# Patient Record
Sex: Male | Born: 1995 | Race: Black or African American | Hispanic: No | Marital: Single | Smoking: Never smoker
Health system: Southern US, Community
[De-identification: ages and names within clinical notes are randomized; demographics above are authoritative.]

## PROBLEM LIST (undated history)

## (undated) DIAGNOSIS — J45909 Unspecified asthma, uncomplicated: Secondary | ICD-10-CM

## (undated) HISTORY — PX: NO PAST SURGERIES: SHX2092

---
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
## 2015-12-05 IMAGING — CR DG CHEST 2V
2 series · 2 of 2 positions shown · non-contrast
Comparison: None.

CLINICAL DATA: Shortness of breath and wheezing. Bilateral anterior
rib pain for 2 days, progressive. Asthma.

EXAM:
CHEST  2 VIEW

[w chest pa]
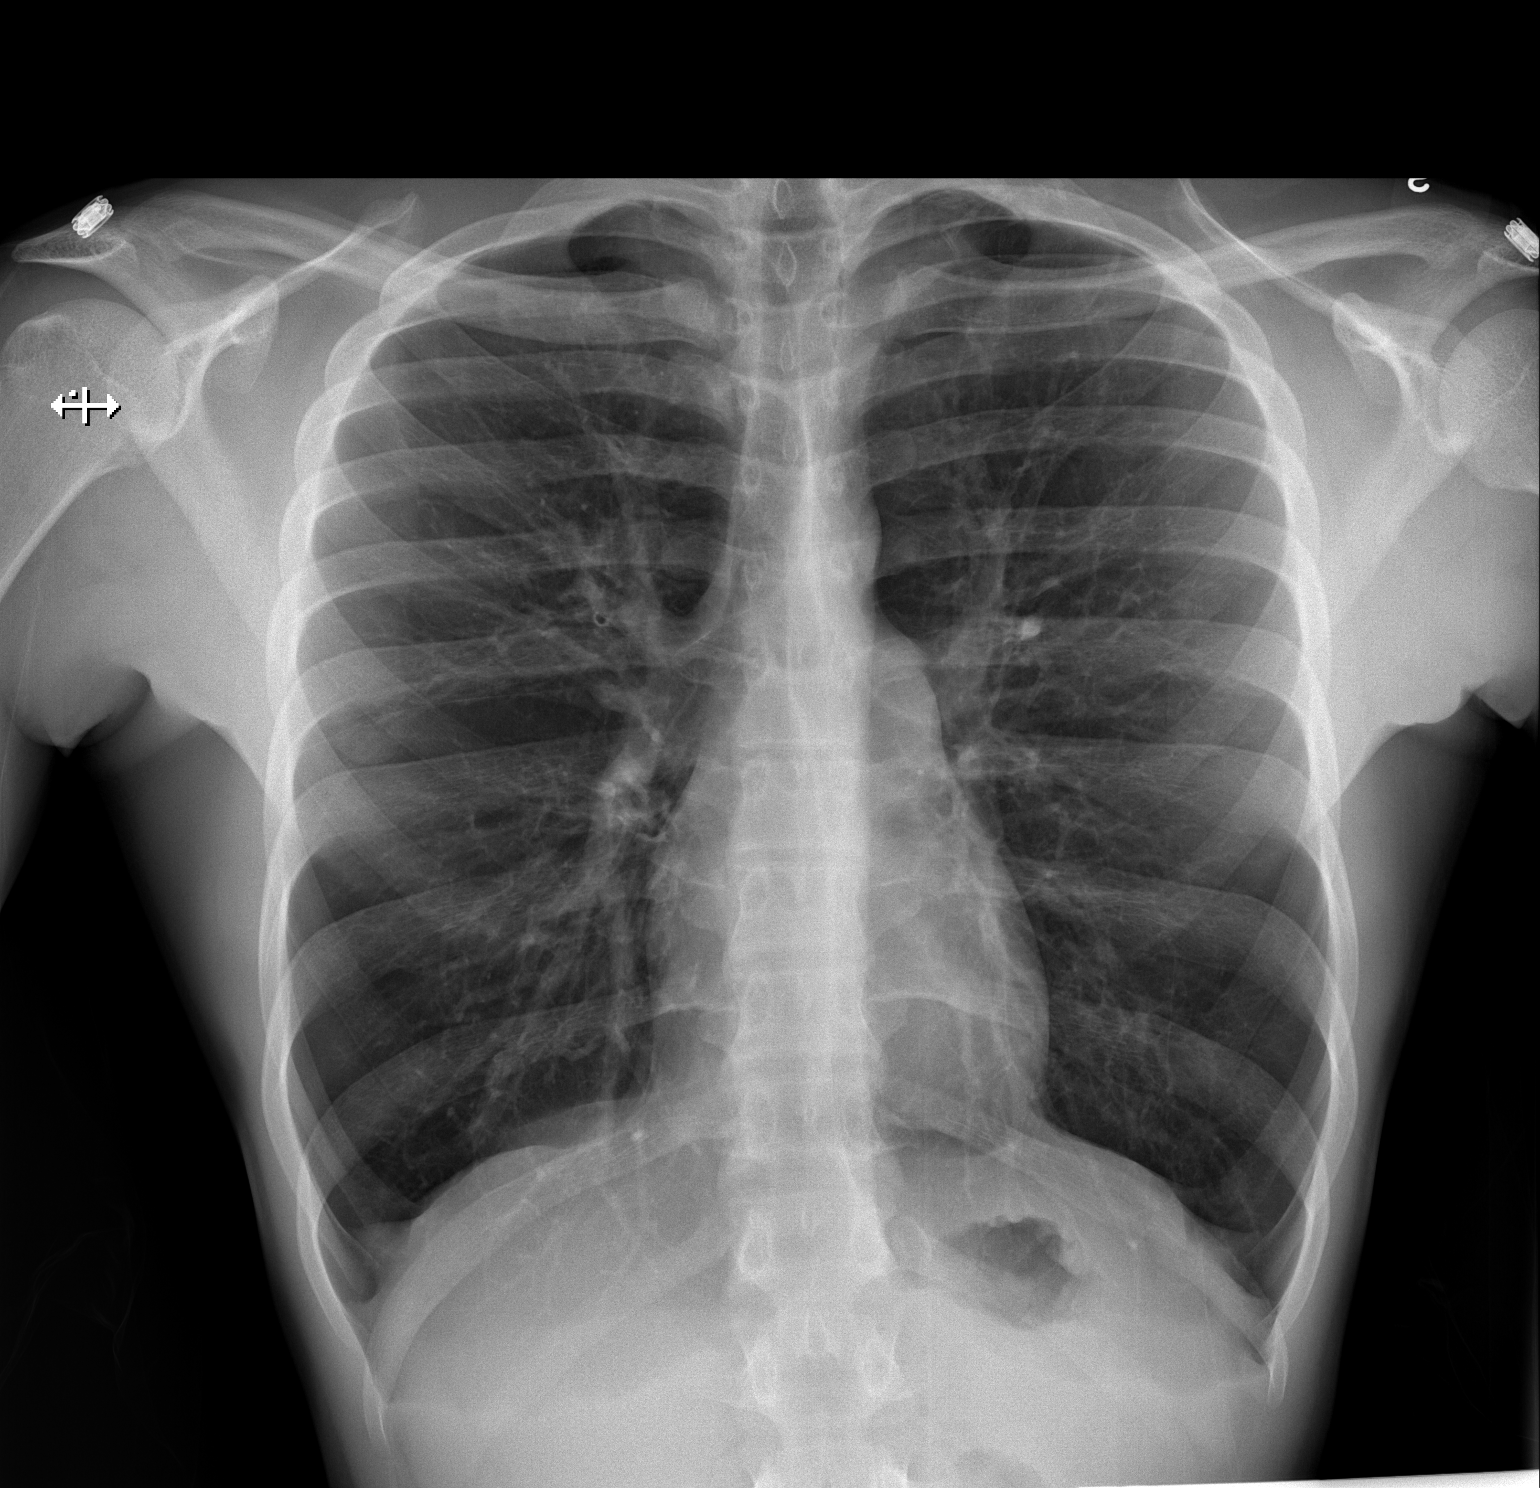

[w chest lat]
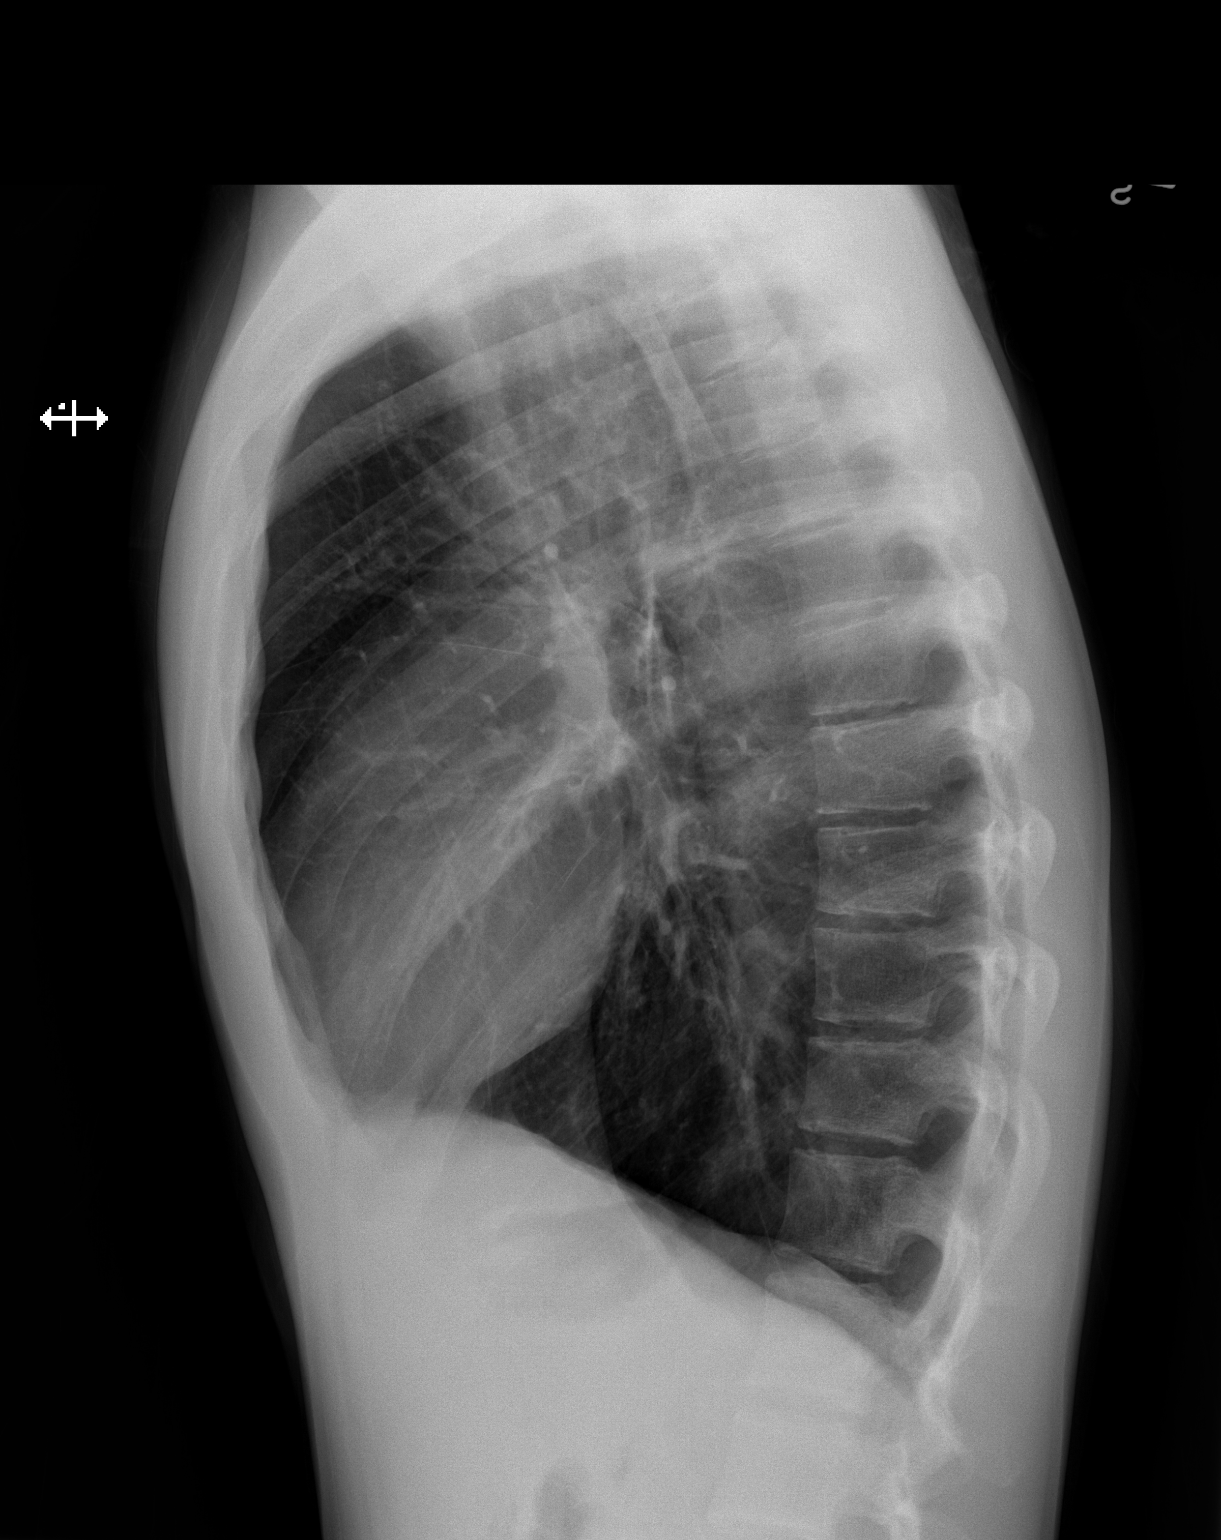

[2 of 2 positions shown; findings below may reference images not displayed]

FINDINGS: The lungs are markedly hyperinflated with peribronchial thickening.
No infiltrates or effusions. No pneumothorax. No osseous
abnormality.
IMPRESSION: Hyperinflated lungs with bronchitic changes. Findings are consistent
with asthma.

## 2016-11-08 ENCOUNTER — Emergency Department (HOSPITAL_COMMUNITY)
Admission: EM | Admit: 2016-11-08 | Discharge: 2016-11-08 | Disposition: A | Discharge status: Home / Self Care | Payer: Self-pay | Attending: Emergency Medicine | Admitting: Emergency Medicine

## 2016-11-08 ENCOUNTER — Encounter (HOSPITAL_COMMUNITY): Payer: Self-pay | Admitting: Emergency Medicine

## 2016-11-08 ENCOUNTER — Emergency Department (HOSPITAL_COMMUNITY): Payer: Self-pay

## 2016-11-08 DIAGNOSIS — J45901 Unspecified asthma with (acute) exacerbation: Secondary | ICD-10-CM | POA: Insufficient documentation

## 2016-11-08 DIAGNOSIS — Z91010 Allergy to peanuts: Secondary | ICD-10-CM | POA: Insufficient documentation

## 2016-11-08 MED ORDER — ALBUTEROL SULFATE (2.5 MG/3ML) 0.083% IN NEBU
5.0000 mg | INHALATION_SOLUTION | Freq: Once | RESPIRATORY_TRACT | Status: AC
  Administered 2016-11-08: 5 mg via RESPIRATORY_TRACT
  Filled 2016-11-08: qty 6

## 2016-11-08 MED ORDER — ALBUTEROL SULFATE (2.5 MG/3ML) 0.083% IN NEBU
5.0000 mg | INHALATION_SOLUTION | Freq: Once | RESPIRATORY_TRACT | Status: AC
  Administered 2016-11-08: 5 mg via RESPIRATORY_TRACT
  Filled 2016-11-08 (×2): qty 6

## 2016-11-08 MED ORDER — FLUTICASONE PROPIONATE 50 MCG/ACT NA SUSP: 2.0000 | Freq: Every day | NASAL | 2 refills | Status: AC

## 2016-11-08 MED ORDER — CETIRIZINE HCL 10 MG PO TABS: 10.0000 mg | ORAL_TABLET | Freq: Every day | ORAL | 1 refills | Status: AC

## 2016-11-08 MED ORDER — PREDNISONE 20 MG PO TABS
60.0000 mg | ORAL_TABLET | Freq: Once | ORAL | Status: AC
  Administered 2016-11-08: 60 mg via ORAL
  Filled 2016-11-08: qty 3

## 2016-11-08 MED ORDER — IPRATROPIUM BROMIDE 0.02 % IN SOLN
0.5000 mg | Freq: Once | RESPIRATORY_TRACT | Status: AC
  Administered 2016-11-08: 0.5 mg via RESPIRATORY_TRACT
  Filled 2016-11-08 (×2): qty 2.5

## 2016-11-08 MED ORDER — IPRATROPIUM BROMIDE 0.02 % IN SOLN
0.5000 mg | Freq: Once | RESPIRATORY_TRACT | Status: AC
  Administered 2016-11-08: 0.5 mg via RESPIRATORY_TRACT
  Filled 2016-11-08: qty 2.5

## 2016-11-08 MED ORDER — PREDNISONE 20 MG PO TABS: 40.0000 mg | ORAL_TABLET | Freq: Every day | ORAL | 0 refills | Status: AC

## 2016-11-08 NOTE — ED Notes (Signed)
Patient transported to X-ray 

## 2016-11-08 NOTE — ED Notes (Signed)
Pt sats 94 to 100% while walking on room air

## 2016-11-08 NOTE — Discharge Instructions (Signed)
1. Medications: albuterol, prednisone, flonase, usual home medications °2. Treatment: rest, drink plenty of fluids, begin OTC antihistamine (Zyrtec or Claritin) °3. Follow Up: Please followup with your primary doctor in 2-3 days for discussion of your diagnoses and further evaluation after today's visit; if you do not have a primary care doctor use the resource guide provided to find one; Please return to the ER for difficulty breathing, high fevers or worsening symptoms. ° °

## 2016-11-08 NOTE — ED Provider Notes (Signed)
[facility]-EMERGENCY DEPT Provider Note   CSN: [id number] Arrival date & time: 11/08/16  0421     History   Chief Complaint Chief Complaint  Patient presents with  . Asthma    HPI Clifford Little is a 20 y.o. male with a hx of Asthma presents to the Emergency Department complaining of gradual, persistent, progressively worsening shortness of breath and wheezing onset 3 days ago. Patient reports that his albuterol inhaler was helping until last night around 9 PM when he ran out. He reports taking albuterol rescue inhaler and Singulair. He reports compliance until the prescription ran out earlier tonight. He states he's having trouble sleeping because of his shortness of breath. He is not taking any antihistamine.  Nothing makes symptoms worse. No fevers or chills, nausea or vomiting. No sick contacts. Patient reports a history of hospitalization but has never been intubated for his asthma. Last dose of steroids was greater than 3 months ago.  The history is provided by the patient and medical records. No language interpreter was used.    Past Medical History:  Diagnosis Date  . Asthma     Patient Active Problem List   Diagnosis Date Noted  . Asthma exacerbation 11/14/2014    Past Surgical History:  Procedure Laterality Date  . NO PAST SURGERIES      OB History    No data available       Home Medications    Prior to Admission medications   Medication Sig Start Date End Date Taking? Authorizing Provider  albuterol (PROVENTIL) (2.5 MG/3ML) 0.083% nebulizer solution Inhale 5 mg into the lungs every 4 (four) hours as needed for wheezing or shortness of breath.  08/09/14  Yes Historical Provider, MD  montelukast (SINGULAIR) 10 MG tablet Take 10 mg by mouth at bedtime as needed (allergies).  08/09/14  Yes Historical Provider, MD  VENTOLIN HFA 108 (90 BASE) MCG/ACT inhaler Inhale 2 puffs into the lungs every 4 (four) hours as needed. For wheezing and shortness of breath. 08/09/14   Yes Historical Provider, MD  cetirizine (ZYRTEC ALLERGY) 10 MG tablet Take 1 tablet (10 mg total) by mouth daily. 11/08/16   Georgia Baria, PA-C  fluticasone (FLONASE) 50 MCG/ACT nasal spray Place 2 sprays into both nostrils daily. 11/08/16   Juanangel Soderholm, PA-C  predniSONE (DELTASONE) 20 MG tablet Take 2 tablets (40 mg total) by mouth daily. 11/08/16   Dahlia ClientHannah Jehiel Koepp, PA-C    Family History Family History  Problem Relation Age of Onset  . Asthma Father     Social History Social History  Substance Use Topics  . Smoking status: Never Smoker  . Smokeless tobacco: Never Used  . Alcohol use No     Allergies   Peanuts [peanut oil]   Review of Systems Review of Systems  Respiratory: Positive for cough, shortness of breath and wheezing.   All other systems reviewed and are negative.    Physical Exam Updated Vital Signs BP 114/70   Pulse (!) 59   Temp 98.3 F (36.8 C) (Oral)   Resp 20   Ht 5\' 8"  (1.727 m)   Wt 56.7 kg   SpO2 100%   BMI 19.01 kg/m   Physical Exam  Constitutional: He appears well-developed and well-nourished. No distress.  Awake, alert, nontoxic appearance  HENT:  Head: Normocephalic and atraumatic.  Mouth/Throat: Oropharynx is clear and moist. No oropharyngeal exudate.  Eyes: Conjunctivae are normal. No scleral icterus.  Neck: Normal range of motion. Neck supple.  Cardiovascular: Normal rate,  regular rhythm and intact distal pulses.   Pulmonary/Chest: Effort normal. Tachypnea noted. No respiratory distress. He has decreased breath sounds. He has wheezes.  Equal chest expansion Inspiratory and Expiratory wheezes throughout  Abdominal: Soft. Bowel sounds are normal. He exhibits no mass. There is no tenderness. There is no rebound and no guarding.  Musculoskeletal: Normal range of motion. He exhibits no edema.  Neurological: He is alert.  Speech is clear and goal oriented Moves extremities without ataxia  Skin: Skin is warm and dry.  He is not diaphoretic.  Psychiatric: He has a normal mood and affect.  Nursing note and vitals reviewed.    ED Treatments / Results   Radiology Dg Chest 2 View  Result Date: 11/08/2016 CLINICAL DATA:  20 y/o M; shortness of breath with history of asthma. EXAM: CHEST  2 VIEW COMPARISON:  11/14/2014 chest radiograph FINDINGS: The heart size and mediastinal contours are within normal limits and stable. Both lungs are clear. The visualized skeletal structures are unremarkable. IMPRESSION: No active cardiopulmonary disease. Electronically Signed   By: Mitzi HansenLance  Furusawa-Stratton M.D.   On: 11/08/2016 05:11    Procedures Procedures (including critical care time)  Medications Ordered in ED Medications  albuterol (PROVENTIL) (2.5 MG/3ML) 0.083% nebulizer solution 5 mg (5 mg Nebulization Given 11/08/16 0435)  albuterol (PROVENTIL) (2.5 MG/3ML) 0.083% nebulizer solution 5 mg (5 mg Nebulization Given 11/08/16 0532)  ipratropium (ATROVENT) nebulizer solution 0.5 mg (0.5 mg Nebulization Given 11/08/16 0533)  predniSONE (DELTASONE) tablet 60 mg (60 mg Oral Given 11/08/16 0533)  albuterol (PROVENTIL) (2.5 MG/3ML) 0.083% nebulizer solution 5 mg (5 mg Nebulization Given 11/08/16 0648)  ipratropium (ATROVENT) nebulizer solution 0.5 mg (0.5 mg Nebulization Given 11/08/16 0648)     Initial Impression / Assessment and Plan / ED Course  I have reviewed the triage vital signs and the nursing notes.  Pertinent labs & imaging results that were available during my care of the patient were reviewed by me and considered in my medical decision making (see chart for details).  Clinical Course     He continues to wheeze but is no distress and is without hypoxia at rest.  He wishes for discharge home. I have discussed risk and benefit of admission versus discharge home. I have offered him admission due to persistent wheezing however he reports he believes he is breathing very close to his baseline.    Patient is  completing his third albuterol treatment. He will ambulate in the hall with oxygen saturation measurement.  At shift change, care transferred to Dr. Nicanor AlconPalumbo who will reassess and determine disposition.    Final Clinical Impressions(s) / ED Diagnoses   Final diagnoses:  Exacerbation of asthma, unspecified asthma severity, unspecified whether persistent    New Prescriptions New Prescriptions   CETIRIZINE (ZYRTEC ALLERGY) 10 MG TABLET    Take 1 tablet (10 mg total) by mouth daily.   FLUTICASONE (FLONASE) 50 MCG/ACT NASAL SPRAY    Place 2 sprays into both nostrils daily.   PREDNISONE (DELTASONE) 20 MG TABLET    Take 2 tablets (40 mg total) by mouth daily.     Dierdre ForthHannah Ladislaus Repsher, PA-C 11/08/16 [phone]    April Palumbo, MD 11/08/16 [phone]

## 2016-11-08 NOTE — ED Notes (Signed)
Pt denies SOB, states medications helped

## 2016-11-08 NOTE — ED Triage Notes (Signed)
Pt has h/s of asthma and ran out of medications. Feels SOB that started around 2100 last night. Did not take any medications

## 2017-11-29 IMAGING — CR DG CHEST 2V
2 series · 2 of 2 positions shown · non-contrast
Comparison: 11/14/2014 chest radiograph

CLINICAL DATA: 20 y/o M; shortness of breath with history of
asthma.

EXAM:
CHEST  2 VIEW

[chest pa]
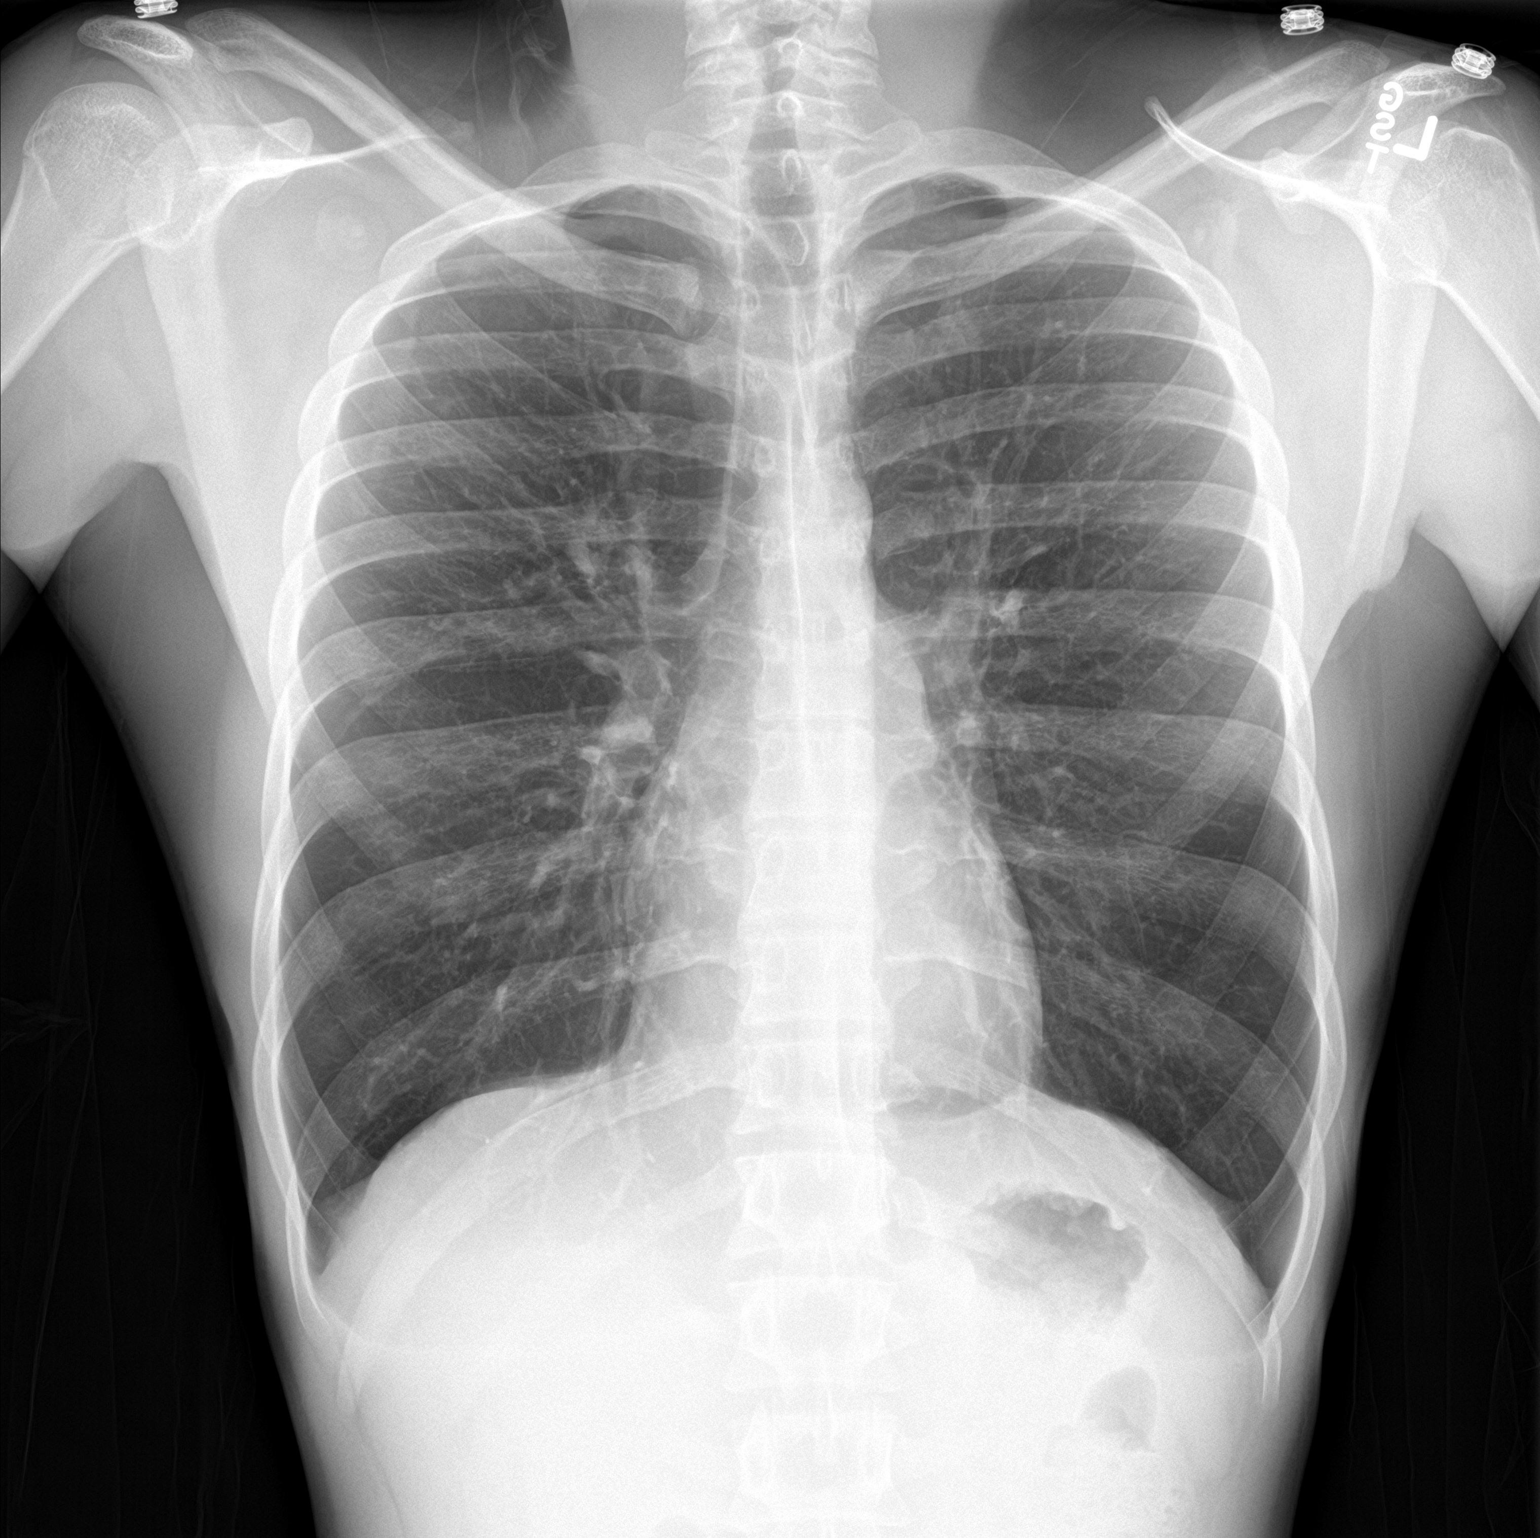

[chest lat]
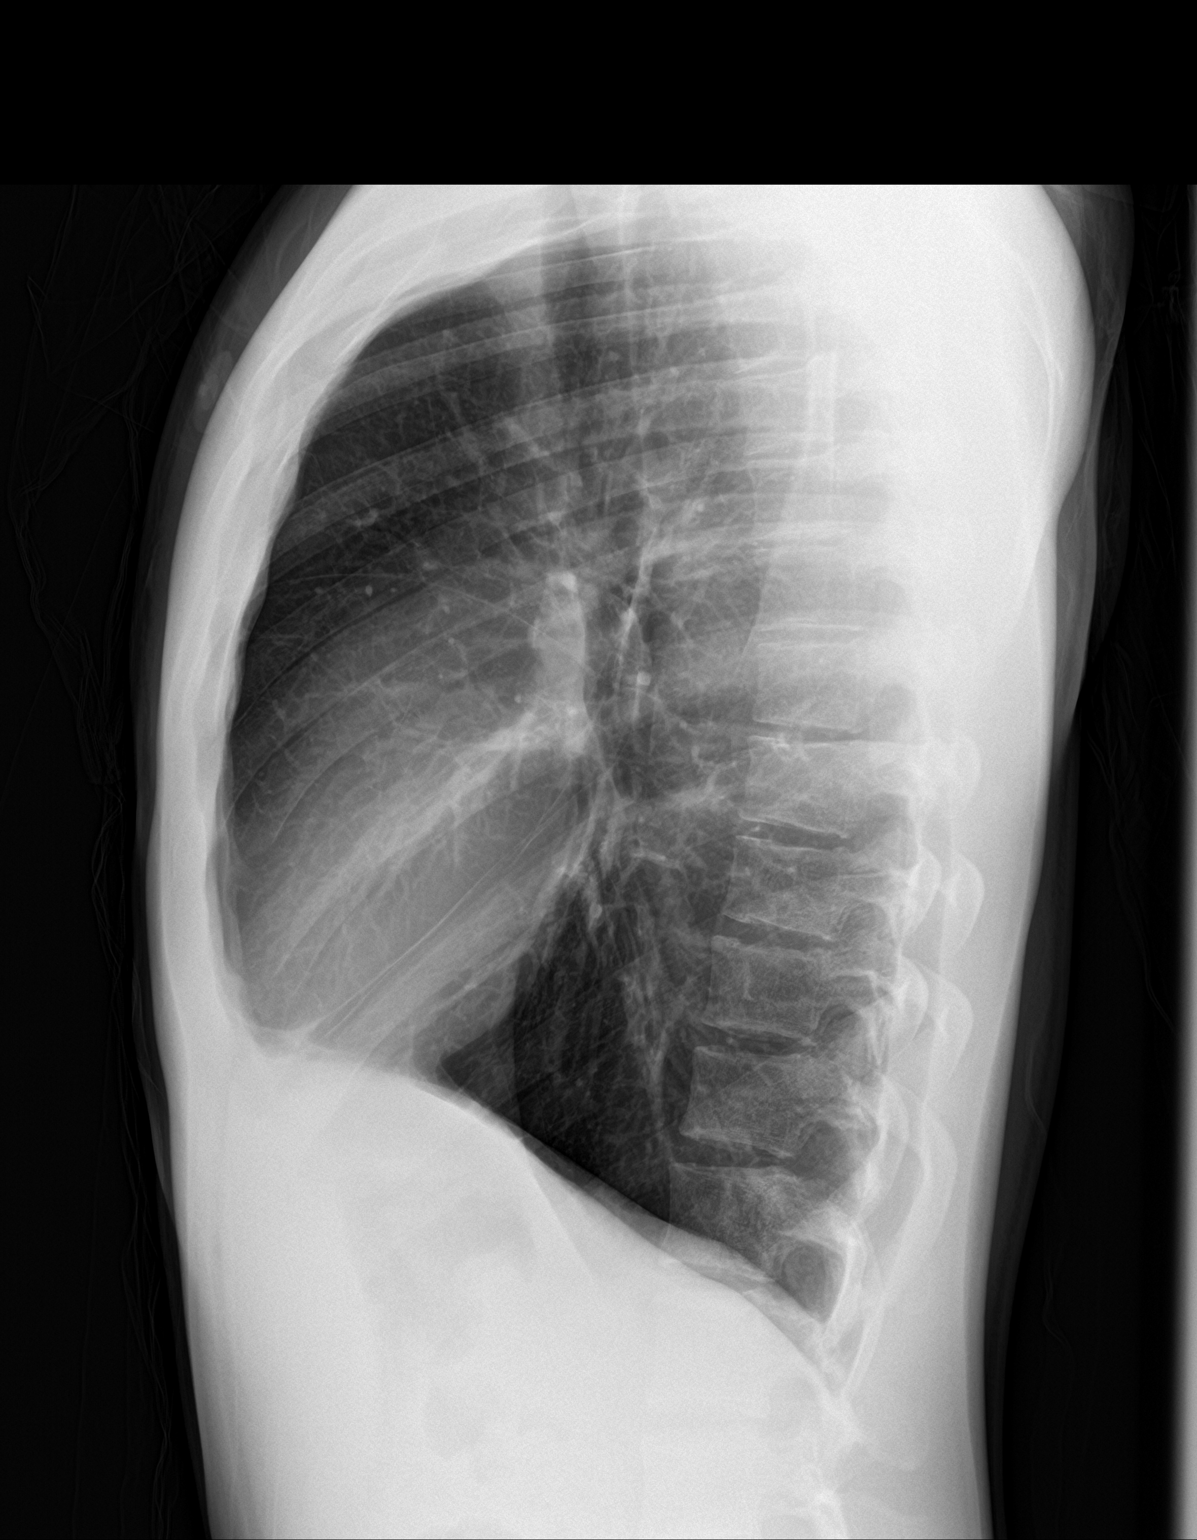

[2 of 2 positions shown; findings below may reference images not displayed]

FINDINGS: The heart size and mediastinal contours are within normal limits and
stable. Both lungs are clear. The visualized skeletal structures are
unremarkable.
IMPRESSION: No active cardiopulmonary disease.

By: Arcinio Ornano M.D.
